# Patient Record
Sex: Female | Born: 1995 | Hispanic: No | Marital: Single | Smoking: Never smoker
Health system: Southern US, Community
[De-identification: ages and names within clinical notes are randomized; demographics above are authoritative.]

## PROBLEM LIST (undated history)

## (undated) HISTORY — PX: NO PAST SURGERIES: SHX2092

---

## 2016-02-09 ENCOUNTER — Encounter (HOSPITAL_COMMUNITY): Payer: Self-pay | Admitting: Emergency Medicine

## 2016-02-09 ENCOUNTER — Emergency Department (HOSPITAL_COMMUNITY): Payer: PPO

## 2016-02-09 DIAGNOSIS — R072 Precordial pain: Secondary | ICD-10-CM | POA: Insufficient documentation

## 2016-02-09 LAB — BASIC METABOLIC PANEL
Anion gap: 8 (ref 5–15)
BUN: 11 mg/dL (ref 6–20)
CALCIUM: 9.3 mg/dL (ref 8.9–10.3)
CO2: 26 mmol/L (ref 22–32)
CREATININE: 0.78 mg/dL (ref 0.44–1.00)
Chloride: 104 mmol/L (ref 101–111)
GFR calc Af Amer: >60 mL/min (ref >60)
GLUCOSE: 85 mg/dL (ref 65–99)
Potassium: 3.5 mmol/L (ref 3.5–5.1)
SODIUM: 138 mmol/L (ref 135–145)

## 2016-02-09 LAB — I-STAT TROPONIN, ED: TROPONIN I, POC: 0 ng/mL (ref 0.00–0.08)

## 2016-02-09 LAB — CBC
HEMATOCRIT: 37.9 % (ref 36.0–46.0)
Hemoglobin: 13.1 g/dL (ref 12.0–15.0)
MCH: 28.7 pg (ref 26.0–34.0)
MCHC: 34.6 g/dL (ref 30.0–36.0)
MCV: 83.1 fL (ref 78.0–100.0)
PLATELETS: 289 10*3/uL (ref 150–400)
RBC: 4.56 MIL/uL (ref 3.87–5.11)
RDW: 13.1 % (ref 11.5–15.5)
WBC: 12.6 10*3/uL — AB (ref 4.0–10.5)

## 2016-02-09 LAB — I-STAT BETA HCG BLOOD, ED (MC, WL, AP ONLY): I-stat hCG, quantitative: <5 m[IU]/mL (ref <5)

## 2016-02-09 NOTE — ED Triage Notes (Signed)
Pt from home with complaints of SOB and CP that began 20 min ago. Pt states the pain is in the center of her chest and radiates to her left chest as well as her back. Pt describes the pain a sharp in nature. Pt denies history of smoking, dm, or htn. Pt states she feels lightheaded and diaphoretic  Pt has normal heart and lung sounds as well as adequate capillary refill  Pain is reproducible.  Pt states she has recently had a nonproductive cough x 2 weeks.

## 2016-02-10 ENCOUNTER — Emergency Department (HOSPITAL_COMMUNITY)
Admission: EM | Admit: 2016-02-10 | Discharge: 2016-02-10 | Disposition: A | Discharge status: Home / Self Care | Payer: PPO | Attending: Emergency Medicine | Admitting: Emergency Medicine

## 2016-02-10 DIAGNOSIS — R079 Chest pain, unspecified: Secondary | ICD-10-CM

## 2016-02-10 LAB — D-DIMER, QUANTITATIVE: D-Dimer, Quant: 0.39 ug/mL-FEU (ref 0.00–0.50)

## 2016-02-10 NOTE — ED Notes (Signed)
Chest pain and mid upper back pain is reproducible. Tender upon palpation. Grimace and states "it is sore when press on midsternum area and upper back area"

## 2016-02-10 NOTE — ED Notes (Signed)
Patient denies pain and is resting comfortably.  

## 2016-02-10 NOTE — ED Notes (Signed)
Patient denies any chest pain at this time. Does state that her back is a little achy. She was sitting at home when the chest pain started. She had recently just come from the gym. Denies any chest pain while working out. She stated while in the gym she did the elliptical machine, abd machine, and arm curl machine. The abd machine she stated she did have to lift up and pull handles down doing stomach crunches. Did not notice any pain during the exercise.

## 2016-02-10 NOTE — Discharge Instructions (Signed)
Your blood work today was reassuring. Your chest x-ray did not show any concerning findings. We advise that you follow-up with a cardiologist regarding your visit to the emergency department today. Return for any new or concerning symptoms.

## 2016-02-10 NOTE — ED Provider Notes (Signed)
[facility]-EMERGENCY DEPT Provider Note   CSN: [id number] Arrival date & time: 02/09/16  2226  By signing my name below, I, Modena Jansky, attest that this documentation has been prepared under the direction and in the presence of non-physician practitioner, Antony Madura, PA-C. Electronically Signed: Modena Jansky, Scribe. 02/10/2016. 1:02 AM.  History   Chief Complaint Chief Complaint  Patient presents with  . Shortness of Breath  . Chest Pain   The history is provided by the patient. No language interpreter was used.  Shortness of Breath  Associated symptoms include chest pain. Pertinent negatives include no fever, no syncope and no vomiting.  Chest Pain   This is a new problem. The current episode started 3 to 5 hours ago. The problem occurs constantly. The problem has been resolved. The pain is present in the substernal region. The pain is moderate. The pain does not radiate. Duration of episode(s) is 1 hour. Associated symptoms include diaphoresis, near-syncope and shortness of breath. Pertinent negatives include no fever, no nausea, no syncope and no vomiting. She has tried nothing for the symptoms.  Pertinent negatives for past medical history include no hypertension.  Her family medical history is significant for hypertension.   HPI Comments: Jasmine Wise is a 20 y.o. female who presents to the Emergency Department complaining of intemittent moderate substernal chest pain that started 3 hours ago. She states that her episode of pain lasted for about an hour. She describes the pain as sharp and non-radiating. She reports that laying down and exertion exacerbated the pain. She reports no medication taken PTA or alleviating factors. She reports associated symptoms of SOB, lightheadedness, and diaphoresis. She denies any fever, nausea, vomiting, LOC, or other complaints. Patient has been chest pain free since spontaneous resolution of her symptoms 1 hour after onset.   History reviewed. No  pertinent past medical history.  There are no active problems to display for this patient.   History reviewed. No pertinent surgical history.  OB History    No data available       Home Medications    Prior to Admission medications   Not on File    Family History No family history on file.  Social History Social History  Substance Use Topics  . Smoking status: Never Smoker  . Smokeless tobacco: Never Used  . Alcohol use No     Allergies   Patient has no known allergies.   Review of Systems Review of Systems  Constitutional: Positive for diaphoresis. Negative for fever.  Respiratory: Positive for shortness of breath.   Cardiovascular: Positive for chest pain and near-syncope. Negative for syncope.  Gastrointestinal: Negative for nausea and vomiting.  Neurological: Positive for light-headedness. Negative for syncope.  All other systems reviewed and are negative.    Physical Exam Updated Vital Signs BP 160/94 (BP Location: Left Arm)   Pulse 105   Temp 98.4 F (36.9 C) (Oral)   Resp 18   Ht 5\' 7"  (1.702 m)   Wt 201 lb (91.2 kg)   LMP 10/09/2015 (Within Weeks) Comment: irregular  SpO2 100%   BMI 31.48 kg/m   Physical Exam  Constitutional: She is oriented to person, place, and time. She appears well-developed and well-nourished. No distress.  Nontoxic appearing and in no distress  HENT:  Head: Normocephalic and atraumatic.  Eyes: Conjunctivae and EOM are normal. No scleral icterus.  Neck: Normal range of motion.  No JVD  Cardiovascular: Normal rate, regular rhythm and intact distal pulses.   Patient not  tachycardic as noted in triage  Pulmonary/Chest: Effort normal. No respiratory distress. She has no wheezes. She has no rales.  Lungs clear to auscultation bilaterally  Musculoskeletal: Normal range of motion.  Neurological: She is alert and oriented to person, place, and time. She exhibits normal muscle tone. Coordination normal.  Skin: Skin is warm  and dry. No rash noted. She is not diaphoretic. No erythema. No pallor.  Psychiatric: She has a normal mood and affect. Her behavior is normal.  Nursing note and vitals reviewed.    ED Treatments / Results  DIAGNOSTIC STUDIES: Oxygen Saturation is 100% on RA, normal by my interpretation.    COORDINATION OF CARE: 1:06 AM- Pt advised of plan for treatment and pt agrees.  Labs (all labs ordered are listed, but only abnormal results are displayed) Labs Reviewed  CBC - Abnormal; Notable for the following:       Result Value   WBC 12.6 (*)    All other components within normal limits  BASIC METABOLIC PANEL  D-DIMER, QUANTITATIVE (NOT AT [facility])  I-STAT TROPOININ, ED  I-STAT BETA HCG BLOOD, ED ([facility], [facility], [facility] ONLY)    EKG  EKG Interpretation  Date/Time:  Tuesday February 09 2016 22:31:58 EST Ventricular Rate:  104 PR Interval:    QRS Duration: 105 QT Interval:  341 QTC Calculation: 449 R Axis:   44 Text Interpretation:  Sinus tachycardia Consider right atrial enlargement Baseline wander in lead(s) III No old tracing to compare Confirmed by Erroll LunaOni, Adeleke Ayokunle [phone]([id number]) on 02/10/2016 2:47:30 AM       Radiology Dg Chest 2 View  Result Date: 02/09/2016 CLINICAL DATA:  Acute onset of sternal chest pain and upper back pain. Shortness of breath and dry cough. Initial encounter. EXAM: CHEST  2 VIEW COMPARISON:  None. FINDINGS: The lungs are well-aerated and clear. There is no evidence of focal opacification, pleural effusion or pneumothorax. The heart is normal in size; the mediastinal contour is within normal limits. No acute osseous abnormalities are seen. IMPRESSION: No acute cardiopulmonary process seen. Electronically Signed   By: Roanna RaiderJeffery  Chang M.D.   On: 02/09/2016 23:48    Procedures Procedures (including critical care time)  Medications Ordered in ED Medications - No data to display   Initial Impression / Assessment and Plan / ED Course  I have reviewed the triage vital  signs and the nursing notes.  Pertinent labs & imaging results that were available during my care of the patient were reviewed by me and considered in my medical decision making (see chart for details).  Clinical Course     20 year old female patient is to the emergency department for evaluation of chest pain. She was initially tachycardic, but this improved over ED course. Patient chest pain-free on my assessment. She has a reassuring workup. Heart score is 1 for family history. Low suspicion for cardiac etiology given age, lack of risk factors, and negative troponin. EKG is nonischemic. Patient also with a negative d-dimer. Doubt pulmonary embolus.   Patient has been chest pain-free since one hour after onset of her symptoms. I do not believe further emergent workup is indicated. Patient stable for discharge with referral to outpatient cardiology. Return precautions given. Patient discharged in stable condition with no unaddressed concerns.   Final Clinical Impressions(s) / ED Diagnoses   Final diagnoses:  Chest pain, unspecified type    New Prescriptions There are no discharge medications for this patient.   I personally performed the services described in this documentation, which  was scribed in my presence. The recorded information has been reviewed and is accurate.    Vitals:   02/09/16 2237 02/10/16 0125 02/10/16 0230  BP: 160/94 122/71 116/63  Pulse: 105 77 78  Resp: 18 18 20   Temp: 98.4 F (36.9 C)    TempSrc: Oral    SpO2: 100% 99% 100%  Weight: 91.2 kg    Height: 5\' 7"  (1.702 m)        Antony MaduraKelly Johnross Nabozny, PA-C 02/10/16 0250    Tomasita CrumbleAdeleke Oni, MD 02/10/16 [id number]

## 2017-04-02 ENCOUNTER — Other Ambulatory Visit: Payer: Self-pay

## 2017-04-02 ENCOUNTER — Ambulatory Visit
Admission: EM | Admit: 2017-04-02 | Discharge: 2017-04-02 | Disposition: A | Discharge status: Home / Self Care | Payer: PRIVATE HEALTH INSURANCE | Attending: Family Medicine | Admitting: Family Medicine

## 2017-04-02 DIAGNOSIS — J029 Acute pharyngitis, unspecified: Secondary | ICD-10-CM | POA: Diagnosis not present

## 2017-04-02 DIAGNOSIS — B9789 Other viral agents as the cause of diseases classified elsewhere: Secondary | ICD-10-CM | POA: Diagnosis not present

## 2017-04-02 DIAGNOSIS — J069 Acute upper respiratory infection, unspecified: Secondary | ICD-10-CM

## 2017-04-02 DIAGNOSIS — R05 Cough: Secondary | ICD-10-CM | POA: Diagnosis not present

## 2017-04-02 LAB — RAPID STREP SCREEN (MED CTR MEBANE ONLY): Streptococcus, Group A Screen (Direct): NEGATIVE

## 2017-04-02 MED ORDER — LIDOCAINE VISCOUS 2 % MT SOLN: OROMUCOSAL | 0 refills | Status: DC

## 2017-04-02 NOTE — ED Triage Notes (Signed)
Patient complains of sore throat and chills x 1 week. Patient states that she has also been having a runny nose with one nosebleed.

## 2017-04-02 NOTE — ED Provider Notes (Signed)
[facility]    CSN: [id number] Arrival date & time: 04/02/17  1353     History   Chief Complaint Chief Complaint  Patient presents with  . Sore Throat    HPI Jasmine Wise is a 22 y.o. female.   The history is provided by the patient.  Sore Throat   URI  Presenting symptoms: congestion, cough, fatigue, rhinorrhea and sore throat   Severity:  Moderate Onset quality:  Sudden Duration:  1 week Timing:  Constant Progression:  Worsening Chronicity:  New Relieved by:  None tried Ineffective treatments:  None tried Associated symptoms: no sinus pain and no wheezing   Risk factors: sick contacts   Risk factors: not elderly, no chronic cardiac disease, no chronic kidney disease, no chronic respiratory disease, no diabetes mellitus, no immunosuppression, no recent illness and no recent travel     History reviewed. No pertinent past medical history.  There are no active problems to display for this patient.   Past Surgical History:  Procedure Laterality Date  . NO PAST SURGERIES      OB History    No data available       Home Medications    Prior to Admission medications   Medication Sig Start Date End Date Taking? Authorizing Provider  norethindrone-ethinyl estradiol-iron (ESTROSTEP FE,TILIA FE,TRI-LEGEST FE) 1-20/1-30/1-35 MG-MCG tablet Take 1 tablet by mouth daily.   Yes [provider]  lidocaine (XYLOCAINE) 2 % solution 20 ml gargle and spit q 6 hours prn sore throat 04/02/17   Payton Mccallumonty, Wilmetta Speiser, MD    Family History Family History  Problem Relation Age of Onset  . Ovarian cancer Mother     Social History Social History   Tobacco Use  . Smoking status: Never Smoker  . Smokeless tobacco: Never Used  Substance Use Topics  . Alcohol use: No  . Drug use: No     Allergies   Patient has no known allergies.   Review of Systems Review of Systems  Constitutional: Positive for fatigue.  HENT: Positive for congestion, rhinorrhea  and sore throat. Negative for sinus pain.   Respiratory: Positive for cough. Negative for wheezing.      Physical Exam Triage Vital Signs ED Triage Vitals  Enc Vitals Group     BP 04/02/17 1452 131/83     Pulse Rate 04/02/17 1452 85     Resp 04/02/17 1452 17     Temp 04/02/17 1452 98.1 F (36.7 C)     Temp Source 04/02/17 1452 Oral     SpO2 04/02/17 1452 99 %     Weight --      Height 04/02/17 1449 5\' 7"  (1.702 m)     Head Circumference --      Peak Flow --      Pain Score 04/02/17 1449 2     Pain Loc --      Pain Edu? --      Excl. in GC? --    No data found.  Updated Vital Signs BP 131/83 (BP Location: Left Arm)   Pulse 85   Temp 98.1 F (36.7 C) (Oral)   Resp 17   Ht 5\' 7"  (1.702 m)   LMP 03/19/2017   SpO2 99%   BMI 31.48 kg/m   Visual Acuity Right Eye Distance:   Left Eye Distance:   Bilateral Distance:    Right Eye Near:   Left Eye Near:    Bilateral Near:     Physical Exam  Constitutional:  She appears well-developed and well-nourished. No distress.  HENT:  Head: Normocephalic and atraumatic.  Right Ear: Tympanic membrane, external ear and ear canal normal.  Left Ear: Tympanic membrane, external ear and ear canal normal.  Nose: Mucosal edema and rhinorrhea present. No nose lacerations, sinus tenderness, nasal deformity, septal deviation or nasal septal hematoma. No epistaxis.  No foreign bodies. Right sinus exhibits no maxillary sinus tenderness and no frontal sinus tenderness. Left sinus exhibits no maxillary sinus tenderness and no frontal sinus tenderness.  Mouth/Throat: Uvula is midline and mucous membranes are normal. Posterior oropharyngeal erythema present. No oropharyngeal exudate, posterior oropharyngeal edema or tonsillar abscesses. No tonsillar exudate.  Eyes: Conjunctivae and EOM are normal. Pupils are equal, round, and reactive to light. Right eye exhibits no discharge. Left eye exhibits no discharge. No scleral icterus.  Neck: Normal range  of motion. Neck supple. No thyromegaly present.  Cardiovascular: Normal rate, regular rhythm and normal heart sounds.  Pulmonary/Chest: Effort normal and breath sounds normal. No respiratory distress. She has no wheezes. She has no rales.  Lymphadenopathy:    She has no cervical adenopathy.  Skin: She is not diaphoretic.  Nursing note and vitals reviewed.    UC Treatments / Results  Labs (all labs ordered are listed, but only abnormal results are displayed) Labs Reviewed  RAPID STREP SCREEN (NOT AT [facility])  CULTURE, GROUP A STREP [facility])    EKG  EKG Interpretation None       Radiology No results found.  Procedures Procedures (including critical care time)  Medications Ordered in UC Medications - No data to display   Initial Impression / Assessment and Plan / UC Course  I have reviewed the triage vital signs and the nursing notes.  Pertinent labs & imaging results that were available during my care of the patient were reviewed by me and considered in my medical decision making (see chart for details).       Final Clinical Impressions(s) / UC Diagnoses   Final diagnoses:  Viral pharyngitis  Viral URI with cough    ED Discharge Orders        Ordered    lidocaine (XYLOCAINE) 2 % solution     04/02/17 1514     1 lab result and diagnosis reviewed with patient 2. rx as per orders above; reviewed possible side effects, interactions, risks and benefits  3. Recommend supportive treatment with rest, fluids, otc analgesics 4. Follow-up prn if symptoms worsen or don't improve  Controlled Substance Prescriptions [place] Controlled Substance Registry consulted? Not Applicable   Payton Mccallum, MD 04/02/17 [phone]

## 2017-04-05 ENCOUNTER — Telehealth: Payer: Self-pay

## 2017-04-05 LAB — CULTURE, GROUP A STREP (THRC)

## 2017-04-05 NOTE — Telephone Encounter (Signed)
Called to follow up with patient since visit here at [facility]. Patient instructed to call back with any questions or concerns. MAH  

## 2017-07-22 ENCOUNTER — Encounter (HOSPITAL_BASED_OUTPATIENT_CLINIC_OR_DEPARTMENT_OTHER): Payer: Self-pay | Admitting: Emergency Medicine

## 2017-07-22 ENCOUNTER — Emergency Department (HOSPITAL_BASED_OUTPATIENT_CLINIC_OR_DEPARTMENT_OTHER)
Admission: EM | Admit: 2017-07-22 | Discharge: 2017-07-22 | Disposition: A | Discharge status: Home / Self Care | Payer: PRIVATE HEALTH INSURANCE | Attending: Emergency Medicine | Admitting: Emergency Medicine

## 2017-07-22 ENCOUNTER — Emergency Department (HOSPITAL_BASED_OUTPATIENT_CLINIC_OR_DEPARTMENT_OTHER): Payer: PRIVATE HEALTH INSURANCE

## 2017-07-22 ENCOUNTER — Other Ambulatory Visit: Payer: Self-pay

## 2017-07-22 DIAGNOSIS — S3210XA Unspecified fracture of sacrum, initial encounter for closed fracture: Secondary | ICD-10-CM | POA: Insufficient documentation

## 2017-07-22 DIAGNOSIS — Y9301 Activity, walking, marching and hiking: Secondary | ICD-10-CM | POA: Insufficient documentation

## 2017-07-22 DIAGNOSIS — W19XXXA Unspecified fall, initial encounter: Secondary | ICD-10-CM

## 2017-07-22 DIAGNOSIS — Y999 Unspecified external cause status: Secondary | ICD-10-CM | POA: Insufficient documentation

## 2017-07-22 DIAGNOSIS — Z79899 Other long term (current) drug therapy: Secondary | ICD-10-CM | POA: Insufficient documentation

## 2017-07-22 DIAGNOSIS — M533 Sacrococcygeal disorders, not elsewhere classified: Secondary | ICD-10-CM | POA: Insufficient documentation

## 2017-07-22 DIAGNOSIS — Y929 Unspecified place or not applicable: Secondary | ICD-10-CM | POA: Insufficient documentation

## 2017-07-22 DIAGNOSIS — W109XXA Fall (on) (from) unspecified stairs and steps, initial encounter: Secondary | ICD-10-CM | POA: Insufficient documentation

## 2017-07-22 DIAGNOSIS — S300XXA Contusion of lower back and pelvis, initial encounter: Secondary | ICD-10-CM | POA: Insufficient documentation

## 2017-07-22 LAB — PREGNANCY, URINE: PREG TEST UR: NEGATIVE

## 2017-07-22 MED ORDER — HYDROCODONE-ACETAMINOPHEN 5-325 MG PO TABS: 1.0000 | ORAL_TABLET | Freq: Four times a day (QID) | ORAL | 0 refills | Status: DC | PRN

## 2017-07-22 MED ORDER — HYDROCODONE-ACETAMINOPHEN 5-325 MG PO TABS
1.0000 | ORAL_TABLET | Freq: Once | ORAL | Status: AC
  Administered 2017-07-22: 1 via ORAL
  Filled 2017-07-22: qty 1

## 2017-07-22 MED ORDER — CYCLOBENZAPRINE HCL 10 MG PO TABS: 10.0000 mg | ORAL_TABLET | Freq: Three times a day (TID) | ORAL | 0 refills | Status: DC | PRN

## 2017-07-22 MED ORDER — NAPROXEN 500 MG PO TABS: 500.0000 mg | ORAL_TABLET | Freq: Two times a day (BID) | ORAL | 0 refills | Status: DC | PRN

## 2017-07-22 NOTE — ED Notes (Signed)
Pt teaching provided on medications that may cause drowsiness. Pt instructed not to drive or operate heavy machinery while taking the prescribed medication. Pt verbalized understanding.   

## 2017-07-22 NOTE — ED Provider Notes (Signed)
[facility] EMERGENCY DEPARTMENT Provider Note   CSN: [id number] Arrival date & time: 07/22/17  2134     History   Chief Complaint Chief Complaint  Patient presents with  . Fall    HPI Jasmine Wise is a 22 y.o. female with a PMHx of PCOS, who presents to the ED with complaints of mechanical fall that occurred around noon.  Patient states that she missed a step when she was coming down the stairs and slid down the entire carpeted staircase on her tailbone/lower back area.  She then proceeded to walk around for the remainder of the day, and when she returned home her tailbone area began hurting.  She describes the pain as 8/10 constant sharp nonradiating low back/tailbone area pain, worse with sitting, laying, and movement, and unrelieved with heat and Tylenol.  She denies hitting her head, LOC, abdominal pain, N/V, dysuria, hematuria, incontinence of urine or stool, saddle anesthesia or cauda equina symptoms, numbness, tingling, focal weakness, myalgias, arthralgias, bruising, swelling, or any other complaints at this time.  She denies any other injury sustained during the incident.  She denies any possibility of pregnancy, LMP was 07/02/2017.  NKDA.   The history is provided by the patient and medical records. No language interpreter was used.  Fall  Pertinent negatives include no abdominal pain.    History reviewed. No pertinent past medical history.  There are no active problems to display for this patient.   Past Surgical History:  Procedure Laterality Date  . NO PAST SURGERIES       OB History   None      Home Medications    Prior to Admission medications   Medication Sig Start Date End Date Taking? Authorizing Provider  citalopram (CELEXA) 20 MG tablet Take 20 mg by mouth daily.   Yes [provider]  lidocaine (XYLOCAINE) 2 % solution 20 ml gargle and spit q 6 hours prn sore throat 04/02/17   Payton Mccallum, MD  norethindrone-ethinyl  estradiol-iron (ESTROSTEP FE,TILIA FE,TRI-LEGEST FE) 1-20/1-30/1-35 MG-MCG tablet Take 1 tablet by mouth daily.    [provider]    Family History Family History  Problem Relation Age of Onset  . Ovarian cancer Mother     Social History Social History   Tobacco Use  . Smoking status: Never Smoker  . Smokeless tobacco: Never Used  Substance Use Topics  . Alcohol use: No  . Drug use: No     Allergies   Patient has no known allergies.   Review of Systems Review of Systems  HENT: Negative for facial swelling (no head inj).   Gastrointestinal: Negative for abdominal pain, nausea and vomiting.  Genitourinary: Negative for difficulty urinating (no incontinence), dysuria and hematuria.  Musculoskeletal: Positive for back pain. Negative for arthralgias, joint swelling and myalgias.  Skin: Negative for color change.  Allergic/Immunologic: Negative for immunocompromised state.  Neurological: Negative for syncope, weakness and numbness.  Hematological: Does not bruise/bleed easily.  Psychiatric/Behavioral: Negative for confusion.     Physical Exam Updated Vital Signs BP 122/62 (BP Location: Left Arm)   Pulse 94   Temp 98.6 F (37 C) (Oral)   Resp 18   Ht  (1.727 m)   Wt 91.2 kg (201 lb)   LMP 06/28/2017   SpO2 100%   BMI 30.56 kg/m   Physical Exam  Constitutional: She is oriented to person, place, and time. Vital signs are normal. She appears well-developed and well-nourished.  Non-toxic appearance. No distress.  Afebrile, nontoxic, NAD  HENT:  Head: Normocephalic and atraumatic.  Mouth/Throat: Mucous membranes are normal.  Eyes: Conjunctivae and EOM are normal. Right eye exhibits no discharge. Left eye exhibits no discharge.  Neck: Normal range of motion. Neck supple. No spinous process tenderness and no muscular tenderness present. No neck rigidity. Normal range of motion present.  FROM intact without spinous process TTP, no bony stepoffs or  deformities, no paraspinous muscle TTP or muscle spasms. No rigidity or meningeal signs. No bruising or swelling.   Cardiovascular: Normal rate and intact distal pulses.  Pulmonary/Chest: Effort normal. No respiratory distress.  Abdominal: Normal appearance. She exhibits no distension.  Musculoskeletal: Normal range of motion.       Back:  All spinal levels with FROM intact, moderate TTP to coccyx area but otherwise no other areas of spinous process TTP in all other spinal levels; no bony stepoffs or deformities, no paraspinous muscle TTP or muscle spasms. Strength and sensation grossly intact in all extremities, negative SLR bilaterally, gait steady and nonantalgic. No overlying skin changes. Distal pulses intact.  No other areas of tenderness to pelvis or extremities. No pelvic instability.   Neurological: She is alert and oriented to person, place, and time. She has normal strength. No sensory deficit.  Skin: Skin is warm, dry and intact. No rash noted.  Psychiatric: She has a normal mood and affect. Her behavior is normal.  Nursing note and vitals reviewed.    ED Treatments / Results  Labs (all labs ordered are listed, but only abnormal results are displayed) Labs Reviewed - No data to display  EKG None  Radiology Dg Sacrum/coccyx  Result Date: 07/22/2017 CLINICAL DATA:  22 year old female with fall and pain over the sacrum. EXAM: SACRUM AND COCCYX - 2+ VIEW COMPARISON:  None. FINDINGS: No displaced fracture identified. There is focal area of apparent discontinuity of the distal sacrum, likely artifactual. A nondisplaced fracture is less likely but not excluded. Clinical correlation is recommended. The soft tissues appear unremarkable breath IMPRESSION: Artifact versus less likely a nondisplaced distal sacral fracture. Clinical correlation is recommended. Electronically Signed   By: Elgie Collard M.D.   On: 07/22/2017 23:09    Procedures Procedures (including critical care  time)  Medications Ordered in ED Medications  HYDROcodone-acetaminophen (NORCO/VICODIN) 5-325 MG per tablet 1 tablet (1 tablet Oral Given 07/22/17 2226)     Initial Impression / Assessment and Plan / ED Course  I have reviewed the triage vital signs and the nursing notes.  Pertinent labs & imaging results that were available during my care of the patient were reviewed by me and considered in my medical decision making (see chart for details).     22 y.o. female here with mechanical fall on the stairs, slid down stairs on her tailbone area, c/o pain there now. On exam, moderate TTP to coccyx, no other focal midline/spinal TTP elsewhere. No red flag s/s of low back pain. No s/s of central cord compression or cauda equina. Lower extremities are neurovascularly intact and patient is ambulating without difficulty. No urinary complaints. Will get xray of coccyx to evaluate for possible fx. Will get Upreg to ensure no pregnancy before doing xray (LMP was 07/02/17). Doubt need for other labs at this time. Will give pain meds and reassess shortly.   11:35 PM Upreg neg. Sacrum/Coccyx xray showing discontinuity of distal sacrum, likely artifactual however possible nondisplaced distal sacral fx not excluded; this is near the area of focal tenderness, so could represent fx; likely  also has contusion of the coccyx. Pt without s/sx of cord compression or other underlying emergent condition, doubt need for further imaging or work up at this time. Pt feeling better with pain meds.   Patient counseled to use ice or heat on back for no longer than 20 minutes every hour. Rx given for muscle relaxer and counseled on proper use of muscle relaxant medication. Urged patient not to drink alcohol, drive, or perform any other activities that requires focus while taking these medications. Rx for naprosyn given. Rx given for narcotic pain medicine and counseled on proper use of narcotic pain medications. Told that they can  increase to every 4 hrs if needed while pain is worse. Counseled not to combine this medication with others containing tylenol. Counseled about not operating machinery or drinking alcohol, etc, while taking this medication.  Advised use of donut pillow for comfort.   Patient urged to follow-up with PCP in 1 week for recheck of symptoms, or sooner if pain does not improve with treatment and rest or if pain becomes recurrent. Urged to return with worsening severe pain, loss of bowel or bladder control, trouble walking, or other worsening of symptoms; explicit return precautions given. The patient verbalizes understanding and agrees with the plan, I have answered their questions. Discharge instructions concerning home care and prescriptions have been given. The patient is STABLE and is discharged to home in good condition.   [place] reviewed prior to dispensing controlled substance medications, and 2 year search was notable for: none found. Risks/benefits/alternatives and expectations discussed regarding controlled substances. Side effects of medications discussed. Informed consent obtained.    Final Clinical Impressions(s) / ED Diagnoses   Final diagnoses:  Closed fracture of sacrum, unspecified portion of sacrum, initial encounter (HCC)  Coccyxdynia  Contusion of coccyx, initial encounter  Fall, initial encounter    ED Discharge Orders        Ordered    cyclobenzaprine (FLEXERIL) 10 MG tablet  3 times daily PRN     07/22/17 2330    naproxen (NAPROSYN) 500 MG tablet  2 times daily PRN     07/22/17 2330    HYDROcodone-acetaminophen (NORCO) 5-325 MG tablet  Every 6 hours PRN     07/22/17 [address] [place], [place] 07/22/17 2336    Charlynne Pander, MD 07/23/17 [phone]

## 2017-07-22 NOTE — ED Triage Notes (Signed)
Patient states that she fell this am and she reports that that she has been walking all day today  - tonight she states that she started to have some lower back pain  - denies any Numbness or tingling

## 2017-07-22 NOTE — Discharge Instructions (Addendum)
Your xray isn't definitive, but it shows a possible crack in the lower portion of your sacrum near your tailbone. You likely also bruised your tailbone as well. Take naprosyn as directed for inflammation and pain with norco for breakthrough pain and flexeril for muscle relaxation. Do not drive or operate machinery with muscle relaxant or narcotic pain medication use. Ice to areas of soreness for the next 24-48 hours and then may move to heat, no more than 20 minutes at a time every hour for each. You may want to buy a donut pillow to use when sitting, to help ease the pain during sitting. Expect to be sore for the next few days to weeks, and follow up with primary care physician for recheck of ongoing symptoms in the next 1 week. Return to ER for emergent changing or worsening of symptoms.

## 2017-11-08 ENCOUNTER — Other Ambulatory Visit: Payer: Self-pay

## 2017-11-08 ENCOUNTER — Emergency Department (HOSPITAL_BASED_OUTPATIENT_CLINIC_OR_DEPARTMENT_OTHER): Payer: BLUE CROSS/BLUE SHIELD

## 2017-11-08 ENCOUNTER — Emergency Department (HOSPITAL_BASED_OUTPATIENT_CLINIC_OR_DEPARTMENT_OTHER)
Admission: EM | Admit: 2017-11-08 | Discharge: 2017-11-09 | Disposition: A | Discharge status: Home / Self Care | Payer: BLUE CROSS/BLUE SHIELD | Attending: Emergency Medicine | Admitting: Emergency Medicine

## 2017-11-08 ENCOUNTER — Encounter (HOSPITAL_BASED_OUTPATIENT_CLINIC_OR_DEPARTMENT_OTHER): Payer: Self-pay | Admitting: Emergency Medicine

## 2017-11-08 DIAGNOSIS — S6991XA Unspecified injury of right wrist, hand and finger(s), initial encounter: Secondary | ICD-10-CM

## 2017-11-08 DIAGNOSIS — Z79899 Other long term (current) drug therapy: Secondary | ICD-10-CM | POA: Diagnosis not present

## 2017-11-08 DIAGNOSIS — Y929 Unspecified place or not applicable: Secondary | ICD-10-CM | POA: Insufficient documentation

## 2017-11-08 DIAGNOSIS — Y9389 Activity, other specified: Secondary | ICD-10-CM | POA: Diagnosis not present

## 2017-11-08 DIAGNOSIS — S61210A Laceration without foreign body of right index finger without damage to nail, initial encounter: Secondary | ICD-10-CM | POA: Diagnosis not present

## 2017-11-08 DIAGNOSIS — Y999 Unspecified external cause status: Secondary | ICD-10-CM | POA: Insufficient documentation

## 2017-11-08 DIAGNOSIS — W2209XA Striking against other stationary object, initial encounter: Secondary | ICD-10-CM | POA: Insufficient documentation

## 2017-11-08 MED ORDER — LIDOCAINE HCL 2 % IJ SOLN
10.0000 mL | Freq: Once | INTRAMUSCULAR | Status: AC
  Administered 2017-11-08: 200 mg via INTRADERMAL
  Filled 2017-11-08: qty 20

## 2017-11-08 NOTE — ED Provider Notes (Signed)
..  Laceration Repair Date/Time: 11/08/2017 11:46 PM Performed by: Jeanie SewerFawze, Breezie Micucci A, PA-C Authorized by: Jeanie SewerFawze, Damyen Knoll A, PA-C   Consent:    Consent obtained:  Verbal   Consent given by:  Patient   Risks discussed:  Infection, poor cosmetic result, pain and poor wound healing Anesthesia (see MAR for exact dosages):    Anesthesia method:  Local infiltration   Local anesthetic:  Lidocaine 2% w/o epi Laceration details:    Location:  Finger   Finger location:  R index finger   Length (cm):  3   Depth (mm):  1 Repair type:    Repair type:  Simple Pre-procedure details:    Preparation:  Patient was prepped and draped in usual sterile fashion and imaging obtained to evaluate for foreign bodies Exploration:    Hemostasis achieved with:  Direct pressure   Wound exploration: wound explored through full range of motion and entire depth of wound probed and visualized     Wound extent: no foreign bodies/material noted, no tendon damage noted and no underlying fracture noted     Contaminated: no   Treatment:    Area cleansed with:  Saline and Betadine   Amount of cleaning:  Extensive   Irrigation solution:  Sterile saline   Irrigation method:  Pressure wash   Visualized foreign bodies/material removed: no   Skin repair:    Repair method:  Sutures   Suture size:  4-0   Suture material:  Prolene   Suture technique:  Simple interrupted   Number of sutures:  6 Approximation:    Approximation:  Close Post-procedure details:    Dressing:  Splint for protection and sterile dressing   Patient tolerance of procedure:  Tolerated well, no immediate complications      Bennye AlmFawze, Emori Mumme A, PA-C 11/08/17 2347    Molpus, Jonny RuizJohn, MD 11/09/17 [phone]

## 2017-11-08 NOTE — ED Triage Notes (Signed)
Pt having a small laceration on top of her right index finger for the past 2 hours, bleeding is control, pt c/o 7/10 pain at this time.

## 2017-11-08 NOTE — ED Provider Notes (Signed)
[facility] Provider Note: Lowella DellJ. Lane Cleve Paolillo, MD, FACEP  CSN: [id number] MRN: [id number] ARRIVAL: 11/08/17 at 2025 ROOM: [facility]   CHIEF COMPLAINT  Laceration   HISTORY OF PRESENT ILLNESS  11/08/17 11:02 PM Jasmine Wise is a 22 y.o. female who has been grieving over the loss of her mother recently.  She got angry about 8 PM and punched a painting.  She has a laceration across the dorsal aspect of her right second PIP joint.  Flexion and extension remain intact and sensation is intact distally.  She rates associated pain as a 7 out of 10.  She states her tetanus is up-to-date.  She denies SI.   History reviewed. No pertinent past medical history.  Past Surgical History:  Procedure Laterality Date  . NO PAST SURGERIES      Family History  Problem Relation Age of Onset  . Ovarian cancer Mother     Social History   Tobacco Use  . Smoking status: Never Smoker  . Smokeless tobacco: Never Used  Substance Use Topics  . Alcohol use: No  . Drug use: No    Prior to Admission medications   Medication Sig Start Date End Date Taking? Authorizing Provider  buPROPion (ZYBAN) 150 MG 12 hr tablet Take 150 mg by mouth 2 (two) times daily.   Yes [provider]  citalopram (CELEXA) 20 MG tablet Take 20 mg by mouth daily.    [provider]  cyclobenzaprine (FLEXERIL) 10 MG tablet Take 1 tablet (10 mg total) by mouth 3 (three) times daily as needed for muscle spasms. 07/22/17   Street, ArgentineMercedes, PA-C  HYDROcodone-acetaminophen (NORCO) 5-325 MG tablet Take 1 tablet by mouth every 6 (six) hours as needed for severe pain. 07/22/17   Street, BellwoodMercedes, PA-C  lidocaine (XYLOCAINE) 2 % solution 20 ml gargle and spit q 6 hours prn sore throat 04/02/17   Payton Mccallumonty, [place], MD  naproxen (NAPROSYN) 500 MG tablet Take 1 tablet (500 mg total) by mouth 2 (two) times daily as needed for mild pain, moderate pain or headache (TAKE WITH MEALS.). 07/22/17   Street, FerrisMercedes, PA-C    norethindrone-ethinyl estradiol-iron (ESTROSTEP FE,TILIA FE,TRI-LEGEST FE) 1-20/1-30/1-35 MG-MCG tablet Take 1 tablet by mouth daily.    [provider]    Allergies Patient has no known allergies.   REVIEW OF SYSTEMS  Negative except as noted here or in the History of Present Illness.   PHYSICAL EXAMINATION  Initial Vital Signs Blood pressure (!) 141/81, pulse (!) 101, temperature 98.5 F (36.9 C), temperature source Oral, resp. rate 19, height 5\' 8"  (1.727 m), weight 86.2 kg, last menstrual period 10/16/2017, SpO2 100 %.  Examination General: Well-developed, well-nourished female in no acute distress; appearance consistent with age of record HENT: normocephalic; atraumatic Eyes: Normal appearance Neck: supple Heart: regular rate and rhythm Lungs: clear to auscultation bilaterally Abdomen: soft; nondistended; nontender; bowel sounds present Extremities: No deformity; full range of motion; pulses normal; laceration over dorsal aspect of right second PIP joint with intact flexion and extension and sensation distally Neurologic: Awake, alert and oriented; motor function intact in all extremities and symmetric; no facial droop Skin: Warm and dry Psychiatric: Normal mood and affect   RESULTS  Summary of this visit's results, reviewed by myself:   EKG Interpretation  Date/Time:    Ventricular Rate:    PR Interval:    QRS Duration:   QT Interval:    QTC Calculation:   R Axis:     Text Interpretation:  Laboratory Studies: No results found for this or any previous visit (from the past 24 hour(s)). Imaging Studies: Dg Finger Index Right  Result Date: 11/08/2017 CLINICAL DATA:  Injury to right index finger.  Cut by wood EXAM: RIGHT INDEX FINGER 2+V COMPARISON:  None. FINDINGS: No acute bony abnormality. Specifically, no fracture, subluxation, or dislocation. No radiopaque foreign bodies. IMPRESSION: No fracture or radiopaque foreign body. Electronically  Signed   By: Charlett Nose M.D.   On: 11/08/2017 21:31    ED COURSE and MDM  Nursing notes and initial vitals signs, including pulse oximetry, reviewed.  Vitals:   11/08/17 2033 11/08/17 2034  BP: (!) 141/81   Pulse: (!) 101   Resp: 19   Temp: 98.5 F (36.9 C)   TempSrc: Oral   SpO2: 100%   Weight:  86.2 kg  Height:  5\' 8"  (1.727 m)    PROCEDURES    ED DIAGNOSES     ICD-10-CM   1. Laceration of right index finger without foreign body without damage to nail, initial encounter S61.210A   2. Finger injury, right, initial encounter S69.91XA DG Finger Index Right    DG Finger Index Right       Eleonor Ocon, MD 11/09/17 0000

## 2017-11-09 ENCOUNTER — Ambulatory Visit (HOSPITAL_COMMUNITY)
Admission: RE | Admit: 2017-11-09 | Discharge: 2017-11-09 | Disposition: A | Discharge status: Home / Self Care | Payer: BLUE CROSS/BLUE SHIELD | Source: Home / Self Care | Attending: Psychiatry | Admitting: Psychiatry

## 2017-11-09 NOTE — H&P (Signed)
Behavioral Health Medical Screening Exam  Jasmine Wise is an 22 y.o. female patient presents as a walk in with complaints of depression related to the death of her mother in January 2019.  Patient states she has had thoughts of wanting to be with her mother but would never try to kill herself.  Patient has outpatient psychiatric services but her roommates wanted her to come here to have evaluation "They don't understand grief and thought I needed to come here."   Total Time spent with patient: 30 minutes  Psychiatric Specialty Exam: Physical Exam  Constitutional: She is oriented to person, place, and time. She appears well-developed and well-nourished.  HENT:  Head: Normocephalic.  Neck: Normal range of motion. Neck supple.  Respiratory: Effort normal.  Musculoskeletal: Normal range of motion.  Neurological: She is alert and oriented to person, place, and time.  Skin: Skin is warm and dry.  Psychiatric: Her speech is normal and behavior is normal. Judgment and thought content normal. Cognition and memory are normal. She exhibits a depressed mood (Stable).    Review of Systems  Skin:       Right index finger bandaged.  Patient states finger was cut on glass when she broke a frame yesterday   Psychiatric/Behavioral: Positive for depression (Stable). Negative for hallucinations, memory loss, substance abuse and suicidal ideas. The patient is not nervous/anxious and does not have insomnia.   All other systems reviewed and are negative.   Blood pressure 132/83, pulse 72, temperature 98.7 F (37.1 C), resp. rate 16, last menstrual period 10/16/2017, SpO2 100 %.There is no height or weight on file to calculate BMI.  General Appearance: Casual  Eye Contact:  Good  Speech:  Clear and Coherent and Normal Rate  Volume:  Normal  Mood:  Depressed  Affect:  Appropriate and Congruent  Thought Process:  Coherent and Goal Directed  Orientation:  Full (Time, Place, and Person)  Thought Content:   Logical  Suicidal Thoughts:  No  Homicidal Thoughts:  No  Memory:  Immediate;   Good Recent;   Good Remote;   Good  Judgement:  Intact  Insight:  Good  Psychomotor Activity:  Normal  Concentration: Concentration: Good and Attention Span: Good  Recall:  Good  Fund of Knowledge:Good  Language: Good  Akathisia:  No  Handed:  Right  AIMS (if indicated):     Assets:  Communication Skills Desire for Improvement Intimacy Physical Health Social Support  Sleep:       Musculoskeletal: Strength & Muscle Tone: within normal limits Gait & Station: normal Patient leans: N/A  Blood pressure 132/83, pulse 72, temperature 98.7 F (37.1 C), resp. rate 16, last menstrual period 10/16/2017, SpO2 100 %.  Recommendations:  Follow up with current outpatient psychiatric provider.  Referral to grief counseling.    Disposition: No evidence of imminent risk to self or others at present.   Patient does not meet criteria for psychiatric inpatient admission. Supportive therapy provided about ongoing stressors. Discussed crisis plan, support from social network, calling 911, coming to the Emergency Department, and calling Suicide Hotline.  Based on my evaluation the patient does not appear to have an emergency medical condition.  Shuvon Rankin, NP 11/09/2017, 2:11 PM

## 2017-11-09 NOTE — BH Assessment (Signed)
Assessment Note  Jasmine Wise is an 22 y.o. female. Pt denies current SI/HI and AVH. Per Pt her mother recently passed away in January 2019 and she having a difficult time processing her grief. The Pt reports depressive symptoms. [facility] The Pt is prescribed Wellbutrin. The Pt denies previous inpatient treatment. Pt denies SA.   Shuvon, NP recommends D/C, follow-up with current providers, and grief counseling at Hospice.  Diagnosis:  F33.1 MDD  Past Medical History: No past medical history on file.  Past Surgical History:  Procedure Laterality Date  . NO PAST SURGERIES      Family History:  Family History  Problem Relation Age of Onset  . Ovarian cancer Mother     Social History:  reports that she has never smoked. She has never used smokeless tobacco. She reports that she does not drink alcohol or use drugs.  Additional Social History:  Alcohol / Drug Use Pain Medications: please see mar Prescriptions: please see mar Over the Counter: please see mar History of alcohol / drug use?: No history of alcohol / drug abuse Longest period of sobriety (when/how long): NA  CIWA: CIWA-Ar BP: 132/83 Pulse Rate: 72 COWS:    Allergies: No Known Allergies  Home Medications:  (Not in a hospital admission)  OB/GYN Status:  Patient's last menstrual period was 10/16/2017.  General Assessment Data [facility] Assessment: In system Is this a Tele or Face-to-Face Assessment?: Face-to-Face Is this an Initial Assessment or a Re-assessment for this encounter?: Initial Assessment Patient Accompanied by:: Other(close friend) Language Other than English: No Living Arrangements: Other (Comment)(home) What gender do you identify as?: Female Marital status: Single Maiden name: NA Pregnancy Status: No Living Arrangements: Alone Can pt return to current living arrangement?: Yes Admission Status:  Voluntary Is patient capable of signing voluntary admission?: Yes Referral Source: Self/Family/Friend Insurance type: BCBS     Crisis Care Plan Living Arrangements: Alone Legal Guardian: Other:(self) Name of Psychiatrist: [employer] psych[facility]rist Name of Therapist: [facility]- [employer]  Education Status Is patient currently in school?: Yes Current Grade: college Highest grade of school patient has completed: 12 Name of school: [employer] Contact person: NA IEP information if applicable: NA  Risk to self with the past 6 months Suicidal Ideation: No-Not Currently/Within Last 6 Months Has patient been a risk to self within the past 6 months prior to admission? : No Suicidal Intent: No Has patient had any suicidal intent within the past 6 months prior to admission? : Other (comment) Is patient at risk for suicide?: No Suicidal Plan?: No Has patient had any suicidal plan within the past 6 months prior to admission? : No Access to Means: No What has been your use of drugs/alcohol within the last 12 months?: NA Previous Attempts/Gestures: No How many times?: 0 Other Self Harm Risks: NA Triggers for Past Attempts: None known Intentional Self Injurious Behavior: None Family Suicide History: No Recent stressful life event(s): Loss (Comment) Persecutory voices/beliefs?: No Depression: Yes Depression Symptoms: Tearfulness, Isolating, Feeling angry/irritable, Loss of interest in usual pleasures Substance abuse history and/or treatment for substance abuse?: No Suicide prevention information given to non-admitted patients: Not applicable  Risk to Others within the past 6 months Homicidal Ideation: No Does patient have any lifetime risk of violence toward others beyond the six months prior to admission? : No Thoughts of Harm to Others: No Current Homicidal Intent: No Current Homicidal Plan: No Access to  Homicidal Means: No Identified Victim: NA History of harm to  others?: No Assessment of Violence: None Noted Violent Behavior Description: NA Does patient have access to weapons?: No Criminal Charges Pending?: No Does patient have a court date: No Is patient on probation?: No  Psychosis Hallucinations: None noted Delusions: None noted  Mental Status Report Appearance/Hygiene: Unremarkable Eye Contact: Fair Motor Activity: Freedom of movement Speech: Logical/coherent Level of Consciousness: Alert Mood: Sad Affect: Sad Anxiety Level: Minimal Thought Processes: Coherent, Relevant Judgement: Unimpaired Orientation: Person, Place, Time, Situation Obsessive Compulsive Thoughts/Behaviors: None  Cognitive Functioning Concentration: Normal Memory: Recent Intact, Remote Intact Is patient IDD: No Insight: Good Impulse Control: Good Appetite: Good Have you had any weight changes? : No Change Sleep: No Change Total Hours of Sleep: 8 Vegetative Symptoms: None  ADLScreening [facility] Assessment Services) Patient's cognitive ability adequate to safely complete daily activities?: Yes Patient able to express need for assistance with ADLs?: Yes Independently performs ADLs?: Yes (appropriate for developmental age)  Prior Inpatient Therapy Prior Inpatient Therapy: No  Prior Outpatient Therapy Prior Outpatient Therapy: Yes Prior Therapy Dates: current Prior Therapy Facilty/Provider(s): Doctor, general practice therapist and psychiatrist  Reason for Treatment: grief Does patient have an ACCT team?: No Does patient have Intensive In-House Services?  : No Does patient have [facility] services? : No Does patient have P4CC services?: No  ADL Screening (condition at time of admission) Patient's cognitive ability adequate to safely complete daily activities?: Yes Is the patient deaf or have difficulty hearing?: No Does the patient have difficulty seeing, even when wearing glasses/contacts?: No Does the patient have difficulty concentrating, remembering, or  making decisions?: No Patient able to express need for assistance with ADLs?: Yes Does the patient have difficulty dressing or bathing?: No Independently performs ADLs?: Yes (appropriate for developmental age)       Abuse/Neglect Assessment (Assessment to be complete while patient is alone) Abuse/Neglect Assessment Can Be Completed: Yes Physical Abuse: Denies Verbal Abuse: Denies Sexual Abuse: Denies Exploitation of patient/patient's resources: Denies     Merchant navy officer (For Healthcare) Does Patient Have a Medical Advance Directive?: No Would patient like information on creating a medical advance directive?: No - Patient declined          Disposition:  Disposition Initial Assessment Completed for this Encounter: Yes Disposition of Patient: Discharge Patient refused recommended treatment: No Mode of transportation if patient is discharged?: Car  On Site Evaluation by:   Reviewed with Physician:    Emmit Pomfret 11/09/2017 3:31 PM
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
## 2022-09-02 ENCOUNTER — Ambulatory Visit (INDEPENDENT_AMBULATORY_CARE_PROVIDER_SITE_OTHER): Payer: BLUE CROSS/BLUE SHIELD

## 2022-09-02 ENCOUNTER — Ambulatory Visit
Admission: RE | Admit: 2022-09-02 | Discharge: 2022-09-02 | Disposition: A | Discharge status: Home / Self Care | Payer: BLUE CROSS/BLUE SHIELD | Source: Ambulatory Visit

## 2022-09-02 VITALS — BP 137/84 | HR 100 | Temp 97.8°F | Resp 18

## 2022-09-02 DIAGNOSIS — R1031 Right lower quadrant pain: Secondary | ICD-10-CM | POA: Diagnosis not present

## 2022-09-02 DIAGNOSIS — M25551 Pain in right hip: Secondary | ICD-10-CM

## 2022-09-02 DIAGNOSIS — M545 Low back pain, unspecified: Secondary | ICD-10-CM

## 2022-09-02 LAB — POCT URINE PREGNANCY: Preg Test, Ur: NEGATIVE

## 2022-09-02 LAB — POCT URINALYSIS DIP (MANUAL ENTRY)
Bilirubin, UA: NEGATIVE
Blood, UA: NEGATIVE
Glucose, UA: NEGATIVE mg/dL
Ketones, POC UA: NEGATIVE mg/dL
Leukocytes, UA: NEGATIVE
Nitrite, UA: NEGATIVE
Protein Ur, POC: NEGATIVE mg/dL
Spec Grav, UA: 1.015 (ref 1.010–1.025)
Urobilinogen, UA: 0.2 E.U./dL
pH, UA: 8 (ref 5.0–8.0)

## 2022-09-02 MED ORDER — NAPROXEN 500 MG PO TABS: 500.0000 mg | ORAL_TABLET | Freq: Two times a day (BID) | ORAL | 0 refills | Status: AC

## 2022-09-02 MED ORDER — CYCLOBENZAPRINE HCL 5 MG PO TABS: 5.0000 mg | ORAL_TABLET | Freq: Three times a day (TID) | ORAL | 0 refills | Status: AC | PRN

## 2022-09-02 NOTE — ED Provider Notes (Signed)
[facility]  Note:  This document was prepared using Conservation officer, historic buildings and may include unintentional dictation errors.  MRN: [id number] DOB: 1995/06/19  Subjective:   Jasmine Wise is a 27 y.o. female presenting for multiple generalized pains.  The most problematic for her is right-sided lower back/hip pain.  She also has anterior hip pain, lower abdominal pain.  No fall, trauma, numbness or tingling, saddle paresthesia, changes to bowel or urinary habits, radicular symptoms.  No urinary symptoms.  No history of kidney stones.  No abdominal surgeries.  Still has her appendix.  No fever, nausea, vomiting, decreased appetite.  No vaginal symptoms.  Patient has a primary sedentary job.  She does play basketball multiple times a week.  No current facility-administered medications for this encounter.  Current Outpatient Medications:    phentermine (ADIPEX-P) 37.5 MG tablet, Take by mouth., Disp: , Rfl:    buPROPion (ZYBAN) 150 MG 12 hr tablet, Take 150 mg by mouth 2 (two) times daily., Disp: , Rfl:    No Known Allergies  History reviewed. No pertinent past medical history.   Past Surgical History:  Procedure Laterality Date   NO PAST SURGERIES      Family History  Problem Relation Age of Onset   Ovarian cancer Mother     Social History   Tobacco Use   Smoking status: Never   Smokeless tobacco: Never  Vaping Use   Vaping Use: Never used  Substance Use Topics   Alcohol use: Yes    Comment: occ   Drug use: No    ROS   Objective:   Vitals: BP 137/84 (BP Location: Right Arm)   Pulse 100   Temp 97.8 F (36.6 C) (Oral)   Resp 18   LMP 08/24/2022 (Approximate)   SpO2 95%   Physical Exam Constitutional:      General: She is not in acute distress.    Appearance: Normal appearance. She is well-developed. She is not ill-appearing, toxic-appearing or diaphoretic.  HENT:     Head: Normocephalic and atraumatic.     Nose: Nose  normal.     Mouth/Throat:     Mouth: Mucous membranes are moist.     Pharynx: Oropharynx is clear.  Eyes:     General: No scleral icterus.       Right eye: No discharge.        Left eye: No discharge.     Extraocular Movements: Extraocular movements intact.     Conjunctiva/sclera: Conjunctivae normal.  Cardiovascular:     Rate and Rhythm: Normal rate.  Pulmonary:     Effort: Pulmonary effort is normal.  Abdominal:     General: Bowel sounds are normal. There is no distension.     Palpations: Abdomen is soft. There is no mass.     Tenderness: There is abdominal tenderness in the right lower quadrant. There is no right CVA tenderness, left CVA tenderness, guarding or rebound.    Musculoskeletal:     Lumbar back: No swelling, edema, deformity, signs of trauma, lacerations, spasms, tenderness or bony tenderness. Normal range of motion. Negative right straight leg raise test and negative left straight leg raise test. No scoliosis.     Right hip: Tenderness present. No deformity, lacerations, bony tenderness or crepitus. Normal range of motion.       Legs:  Skin:    General: Skin is warm and dry.  Neurological:     General: No focal deficit present.  Mental Status: She is alert and oriented to person, place, and time.  Psychiatric:        Mood and Affect: Mood normal.        Behavior: Behavior normal.        Thought Content: Thought content normal.        Judgment: Judgment normal.     Results for orders placed or performed during the hospital encounter of 09/02/22 (from the past 24 hour(s))  POCT urine pregnancy     Status: None   Collection Time: 09/02/22  5:09 PM  Result Value Ref Range   Preg Test, Ur Negative Negative  POCT urinalysis dipstick     Status: None   Collection Time: 09/02/22  5:09 PM  Result Value Ref Range   Color, UA yellow yellow   Clarity, UA clear clear   Glucose, UA negative negative mg/dL   Bilirubin, UA negative negative   Ketones, POC UA  negative negative mg/dL   Spec Grav, UA 1.610 9.604 - 1.025   Blood, UA negative negative   pH, UA 8.0 5.0 - 8.0   Protein Ur, POC negative negative mg/dL   Urobilinogen, UA 0.2 0.2 or 1.0 E.U./dL   Nitrite, UA Negative Negative   Leukocytes, UA Negative Negative   DG Hip Unilat With Pelvis 2-3 Views Right  Result Date: 09/02/2022 CLINICAL DATA:  Right lower back and right-sided abdominal pain, hip pain EXAM: DG HIP (WITH OR WITHOUT PELVIS) 2-3V RIGHT COMPARISON:  None Available. FINDINGS: Frontal view of the pelvis as well as frontal and frogleg lateral views of the right hip are obtained. No fracture, subluxation, or dislocation. Joint spaces are well preserved. Soft tissues are unremarkable. IMPRESSION: 1. Unremarkable pelvis and right hip. Electronically Signed   By: Sharlet Salina M.D.   On: 09/02/2022 17:39    Assessment and Plan :   PDMP not reviewed this encounter.  1. RLQ abdominal pain   2. Acute right-sided low back pain without sciatica   3. Right hip pain    Differential does include acute abdomen, appendicitis.  Discussed this with the patient but she does not want to present to the emergency room now.  Her primary pain is over the posterior right hip, low back.  Is very focal in nature.  As such, I recommended naproxen, Flexeril for musculoskeletal pain, right hip strain, low back strain, abdominal strain.  Maintain strict ER precautions.  Counseled patient on potential for adverse effects with medications prescribed today, patient verbalized understanding.    Wallis Bamberg, [place] 09/02/22 1756

## 2022-09-02 NOTE — ED Triage Notes (Addendum)
Pt c/o right side lower back pain and right lower abd pain x 2 weeks-denies injury-some relief with advil-NAD-steady gait

## 2023-07-19 ENCOUNTER — Emergency Department (HOSPITAL_BASED_OUTPATIENT_CLINIC_OR_DEPARTMENT_OTHER)
Admission: EM | Admit: 2023-07-19 | Discharge: 2023-07-19 | Disposition: A | Discharge status: Home / Self Care | Attending: Emergency Medicine | Admitting: Emergency Medicine

## 2023-07-19 ENCOUNTER — Emergency Department (HOSPITAL_BASED_OUTPATIENT_CLINIC_OR_DEPARTMENT_OTHER)

## 2023-07-19 DIAGNOSIS — Z23 Encounter for immunization: Secondary | ICD-10-CM | POA: Diagnosis not present

## 2023-07-19 DIAGNOSIS — W260XXA Contact with knife, initial encounter: Secondary | ICD-10-CM | POA: Diagnosis not present

## 2023-07-19 DIAGNOSIS — S6991XA Unspecified injury of right wrist, hand and finger(s), initial encounter: Secondary | ICD-10-CM | POA: Diagnosis present

## 2023-07-19 DIAGNOSIS — S61411A Laceration without foreign body of right hand, initial encounter: Secondary | ICD-10-CM | POA: Diagnosis not present

## 2023-07-19 MED ORDER — IBUPROFEN 800 MG PO TABS: 800.0000 mg | ORAL_TABLET | Freq: Three times a day (TID) | ORAL | 0 refills | Status: AC | PRN

## 2023-07-19 MED ORDER — TETANUS-DIPHTH-ACELL PERTUSSIS 5-2.5-18.5 LF-MCG/0.5 IM SUSY
0.5000 mL | PREFILLED_SYRINGE | Freq: Once | INTRAMUSCULAR | Status: AC
  Administered 2023-07-19: 0.5 mL via INTRAMUSCULAR
  Filled 2023-07-19: qty 0.5

## 2023-07-19 MED ORDER — CEPHALEXIN 500 MG PO CAPS: 500.0000 mg | ORAL_CAPSULE | Freq: Two times a day (BID) | ORAL | 0 refills | Status: AC

## 2023-07-19 MED ORDER — LIDOCAINE-EPINEPHRINE (PF) 2 %-1:200000 IJ SOLN
10.0000 mL | Freq: Once | INTRAMUSCULAR | Status: AC
  Administered 2023-07-19: 10 mL
  Filled 2023-07-19: qty 20

## 2023-07-19 MED ORDER — CEPHALEXIN 250 MG PO CAPS
500.0000 mg | ORAL_CAPSULE | Freq: Once | ORAL | Status: AC
  Administered 2023-07-19: 500 mg via ORAL
  Filled 2023-07-19: qty 2

## 2023-07-19 NOTE — Discharge Instructions (Signed)
 You were seen in the emerged from today with laceration to your hand.  I suspect that you have injured your tendons and will need to see a hand surgeon.  Please call the number provided for an appointment on Friday or first thing Monday.  Please continue your antibiotics as prescribed.  You may take Tylenol  and/or ibuprofen as needed for pain.  Do not submerge the hand in water as this can cause infection. Keep the area clean and dry.

## 2023-07-19 NOTE — ED Triage Notes (Signed)
 Patient reports watching a basketball game while cutting food with knife.  Lacerations to right hand.
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
